# Patient Record
Sex: Male | Born: 1990 | Race: Black or African American | Hispanic: No | Marital: Single
Health system: Southern US, Community
[De-identification: ages and names within clinical notes are randomized; demographics above are authoritative.]

---

## 2019-01-27 ENCOUNTER — Ambulatory Visit: Payer: Self-pay | Attending: Internal Medicine

## 2019-05-01 ENCOUNTER — Emergency Department (HOSPITAL_COMMUNITY): Payer: Self-pay

## 2019-05-01 ENCOUNTER — Other Ambulatory Visit: Payer: Self-pay

## 2019-05-01 ENCOUNTER — Emergency Department (HOSPITAL_COMMUNITY)
Admission: EM | Admit: 2019-05-01 | Discharge: 2019-05-01 | Disposition: A | Discharge status: Home / Self Care | Payer: Self-pay | Attending: Emergency Medicine | Admitting: Emergency Medicine

## 2019-05-01 DIAGNOSIS — F1010 Alcohol abuse, uncomplicated: Secondary | ICD-10-CM | POA: Insufficient documentation

## 2019-05-01 DIAGNOSIS — T148XXA Other injury of unspecified body region, initial encounter: Secondary | ICD-10-CM

## 2019-05-01 DIAGNOSIS — R911 Solitary pulmonary nodule: Secondary | ICD-10-CM | POA: Insufficient documentation

## 2019-05-01 DIAGNOSIS — Y906 Blood alcohol level of 120-199 mg/100 ml: Secondary | ICD-10-CM | POA: Insufficient documentation

## 2019-05-01 DIAGNOSIS — S0181XA Laceration without foreign body of other part of head, initial encounter: Secondary | ICD-10-CM | POA: Insufficient documentation

## 2019-05-01 DIAGNOSIS — Y929 Unspecified place or not applicable: Secondary | ICD-10-CM | POA: Insufficient documentation

## 2019-05-01 DIAGNOSIS — Y998 Other external cause status: Secondary | ICD-10-CM | POA: Insufficient documentation

## 2019-05-01 DIAGNOSIS — S21219A Laceration without foreign body of unspecified back wall of thorax without penetration into thoracic cavity, initial encounter: Secondary | ICD-10-CM | POA: Insufficient documentation

## 2019-05-01 DIAGNOSIS — Y9389 Activity, other specified: Secondary | ICD-10-CM | POA: Insufficient documentation

## 2019-05-01 LAB — CBC
HCT: 42.5 % (ref 39.0–52.0)
Hemoglobin: 13.6 g/dL (ref 13.0–17.0)
MCH: 28.4 pg (ref 26.0–34.0)
MCHC: 32 g/dL (ref 30.0–36.0)
MCV: 88.7 fL (ref 80.0–100.0)
Platelets: 278 10*3/uL (ref 150–400)
RBC: 4.79 MIL/uL (ref 4.22–5.81)
RDW: 12.1 % (ref 11.5–15.5)
WBC: 6.4 10*3/uL (ref 4.0–10.5)
nRBC: 0 % (ref 0.0–0.2)

## 2019-05-01 LAB — PROTIME-INR
INR: 1.1 (ref 0.8–1.2)
Prothrombin Time: 13.6 seconds (ref 11.4–15.2)

## 2019-05-01 LAB — I-STAT CHEM 8, ED
BUN: 8 mg/dL (ref 6–20)
Calcium, Ion: 1.02 mmol/L — ABNORMAL LOW (ref 1.15–1.40)
Chloride: 106 mmol/L (ref 98–111)
Creatinine, Ser: 1.4 mg/dL — ABNORMAL HIGH (ref 0.61–1.24)
Glucose, Bld: 105 mg/dL — ABNORMAL HIGH (ref 70–99)
HCT: 42 % (ref 39.0–52.0)
Hemoglobin: 14.3 g/dL (ref 13.0–17.0)
Potassium: 3.4 mmol/L — ABNORMAL LOW (ref 3.5–5.1)
Sodium: 143 mmol/L (ref 135–145)
TCO2: 21 mmol/L — ABNORMAL LOW (ref 22–32)

## 2019-05-01 LAB — COMPREHENSIVE METABOLIC PANEL
ALT: 26 U/L (ref 0–44)
AST: 35 U/L (ref 15–41)
Albumin: 4.2 g/dL (ref 3.5–5.0)
Alkaline Phosphatase: 69 U/L (ref 38–126)
Anion gap: 19 — ABNORMAL HIGH (ref 5–15)
BUN: 8 mg/dL (ref 6–20)
CO2: 16 mmol/L — ABNORMAL LOW (ref 22–32)
Calcium: 8.7 mg/dL — ABNORMAL LOW (ref 8.9–10.3)
Chloride: 101 mmol/L (ref 98–111)
Creatinine, Ser: 1.27 mg/dL — ABNORMAL HIGH (ref 0.61–1.24)
GFR calc Af Amer: >60 mL/min (ref >60)
GFR calc non Af Amer: >60 mL/min (ref >60)
Glucose, Bld: 115 mg/dL — ABNORMAL HIGH (ref 70–99)
Potassium: 3.3 mmol/L — ABNORMAL LOW (ref 3.5–5.1)
Sodium: 136 mmol/L (ref 135–145)
Total Bilirubin: 0.9 mg/dL (ref 0.3–1.2)
Total Protein: 7.6 g/dL (ref 6.5–8.1)

## 2019-05-01 LAB — SAMPLE TO BLOOD BANK

## 2019-05-01 LAB — LACTIC ACID, PLASMA: Lactic Acid, Venous: 8.7 mmol/L (ref 0.5–1.9)

## 2019-05-01 LAB — ETHANOL: Alcohol, Ethyl (B): 187 mg/dL — ABNORMAL HIGH (ref <10)

## 2019-05-01 MED ORDER — BACITRACIN ZINC 500 UNIT/GM EX OINT
TOPICAL_OINTMENT | Freq: Once | CUTANEOUS | Status: AC
  Filled 2019-05-01: qty 0.9

## 2019-05-01 MED ORDER — HYDROCODONE-ACETAMINOPHEN 5-325 MG PO TABS: 1.0000 | ORAL_TABLET | Freq: Four times a day (QID) | ORAL | 0 refills | Status: DC | PRN

## 2019-05-01 MED ORDER — TETANUS-DIPHTH-ACELL PERTUSSIS 5-2.5-18.5 LF-MCG/0.5 IM SUSP
0.5000 mL | Freq: Once | INTRAMUSCULAR | Status: AC
  Administered 2019-05-01: 0.5 mL via INTRAMUSCULAR

## 2019-05-01 MED ORDER — DOXYCYCLINE HYCLATE 100 MG PO TABS
100.0000 mg | ORAL_TABLET | Freq: Once | ORAL | Status: AC
  Administered 2019-05-01: 100 mg via ORAL
  Filled 2019-05-01: qty 1

## 2019-05-01 MED ORDER — HYDROCODONE-ACETAMINOPHEN 5-325 MG PO TABS
1.0000 | ORAL_TABLET | Freq: Once | ORAL | Status: AC
  Administered 2019-05-01: 1 via ORAL
  Filled 2019-05-01: qty 1

## 2019-05-01 MED ORDER — DOXYCYCLINE HYCLATE 100 MG PO CAPS: 100.0000 mg | ORAL_CAPSULE | Freq: Two times a day (BID) | ORAL | 0 refills | Status: AC

## 2019-05-01 MED ORDER — IOHEXOL 300 MG/ML  SOLN
75.0000 mL | Freq: Once | INTRAMUSCULAR | Status: AC | PRN
  Administered 2019-05-01: 75 mL via INTRAVENOUS

## 2019-05-01 MED ORDER — LIDOCAINE-EPINEPHRINE (PF) 2 %-1:200000 IJ SOLN
20.0000 mL | Freq: Once | INTRAMUSCULAR | Status: AC
  Administered 2019-05-01: 20 mL via INTRADERMAL
  Filled 2019-05-01: qty 20
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
## 2019-05-01 NOTE — ED Notes (Signed)
Patient frequently calls out for pain medication and states that his pain is unbearable. Pt states that he just needs his pain medication and his orders "to go". Pt advised that his nurse and provider are in another room with another patient. Patient again states, "I just my pain medicine and to to", and then states "I'm not trying to rush you". Pulled pt's nurse from another room to assist pt per his request. Will continue to monitor.
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
## 2020-12-12 ENCOUNTER — Encounter (HOSPITAL_COMMUNITY): Payer: Self-pay

## 2020-12-12 ENCOUNTER — Ambulatory Visit (HOSPITAL_COMMUNITY)
Admission: EM | Admit: 2020-12-12 | Discharge: 2020-12-12 | Disposition: A | Discharge status: Home / Self Care | Payer: Self-pay | Attending: Physician Assistant | Admitting: Physician Assistant

## 2020-12-12 ENCOUNTER — Other Ambulatory Visit: Payer: Self-pay

## 2020-12-12 ENCOUNTER — Ambulatory Visit (INDEPENDENT_AMBULATORY_CARE_PROVIDER_SITE_OTHER): Payer: Self-pay

## 2020-12-12 DIAGNOSIS — S61214A Laceration without foreign body of right ring finger without damage to nail, initial encounter: Secondary | ICD-10-CM

## 2020-12-12 DIAGNOSIS — Z113 Encounter for screening for infections with a predominantly sexual mode of transmission: Secondary | ICD-10-CM | POA: Insufficient documentation

## 2020-12-12 DIAGNOSIS — S6991XA Unspecified injury of right wrist, hand and finger(s), initial encounter: Secondary | ICD-10-CM | POA: Insufficient documentation

## 2020-12-12 DIAGNOSIS — S61314A Laceration without foreign body of right ring finger with damage to nail, initial encounter: Secondary | ICD-10-CM | POA: Insufficient documentation

## 2020-12-12 MED ORDER — CEPHALEXIN 500 MG PO CAPS: 500.0000 mg | ORAL_CAPSULE | Freq: Four times a day (QID) | ORAL | 0 refills | Status: AC

## 2020-12-12 NOTE — Discharge Instructions (Signed)
Your x-ray was normal with no evidence of a fracture or dislocation.  Please keep area clean as we discussed.  Start antibiotics.  Follow-up with hand surgeon.  Please call them to schedule an appointment on Friday.  In the meantime, if you have any worsening symptoms including increased pain, redness, swelling, difficulty moving your finger you must go to the emergency room.
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
## 2020-12-12 NOTE — ED Notes (Signed)
PT given information on signs of decreased perfusion and instructed to unwrap dressing and re dress loosely if decreased perfusion suspected. If symptoms do not resolve within minutes, seek emergency care.

## 2020-12-12 NOTE — ED Provider Notes (Addendum)
[facility]    CSN: [id number] Arrival date & time: 12/12/20  4818      History   Chief Complaint Chief Complaint  Patient presents with   Laceration   SEXUALLY TRANSMITTED DISEASE    HPI Francisco Salazar is a 30 y.o. male.   Patient presents today that approximately 2 days ago he was bowling when he slipped and fell causing a ball to grip his right hand leading to a laceration.  He reports ongoing pain and bleeding since that time.  Pain is rated 7 on a 0-10 pain scale, localized to laceration, described as aching, no aggravating relieving factors identified.  He has applied antibiotic ointment without improvement of symptoms.  He reports ongoing bleeding prompting evaluation.  He is right-handed.  Denies any numbness or paresthesias but is having difficulty flexing distal right ring finger.  Patient initially wanted to be tested for STI but when he found out that we have penile swabs rather than urine declined to have this done and will go to health department instead where they have urine testing.  He did have some symptoms including urinary frequency but states these have since resolved.  Denies any known exposure.   History reviewed. No pertinent past medical history.  There are no problems to display for this patient.   History reviewed. No pertinent surgical history.     Home Medications    Prior to Admission medications   Medication Sig Start Date End Date Taking? Authorizing Provider  cephALEXin (KEFLEX) 500 MG capsule Take 1 capsule (500 mg total) by mouth 4 (four) times daily. 12/12/20  Yes Gladiola Madore, Noberto Retort, PA-C    Family History History reviewed. No pertinent family history.  Social History Social History   Tobacco Use   Smoking status: Never   Smokeless tobacco: Never  Substance Use Topics   Alcohol use: Yes   Drug use: Not Currently     Allergies   Patient has no known allergies.   Review of Systems Review of Systems   Constitutional:  Positive for activity change. Negative for appetite change, fatigue and fever.  Respiratory:  Negative for cough and shortness of breath.   Cardiovascular:  Negative for chest pain.  Gastrointestinal:  Negative for abdominal pain, diarrhea, nausea and vomiting.  Musculoskeletal:  Positive for arthralgias and joint swelling. Negative for myalgias.  Skin:  Positive for wound.  Neurological:  Negative for dizziness, weakness, light-headedness, numbness and headaches.    Physical Exam Triage Vital Signs ED Triage Vitals  Enc Vitals Group     BP 12/12/20 0938 (!) 147/93     Pulse Rate 12/12/20 0938 65     Resp 12/12/20 0938 16     Temp 12/12/20 0938 98.1 F (36.7 C)     Temp Source 12/12/20 0938 Oral     SpO2 12/12/20 0938 100 %     Weight --      Height --      Head Circumference --      Peak Flow --      Pain Score 12/12/20 0939 7     Pain Loc --      Pain Edu? --      Excl. in GC? --    No data found.  Updated Vital Signs BP (!) 147/93 (BP Location: Right Arm)   Pulse 65   Temp 98.1 F (36.7 C) (Oral)   Resp 16   SpO2 100%   Visual Acuity Right Eye Distance:   Left  Eye Distance:   Bilateral Distance:    Right Eye Near:   Left Eye Near:    Bilateral Near:     Physical Exam Vitals reviewed.  Constitutional:      General: He is awake.     Appearance: Normal appearance. He is well-developed. He is not ill-appearing.     Comments: Very pleasant male appears at age in no acute distress sitting comfortably in exam room  HENT:     Head: Normocephalic and atraumatic.  Cardiovascular:     Rate and Rhythm: Normal rate and regular rhythm.     Pulses:          Radial pulses are 2+ on the right side and 2+ on the left side.     Heart sounds: Normal heart sounds, S1 normal and S2 normal. No murmur heard.    Comments: Capillary refill within 2 seconds right fingers Pulmonary:     Effort: Pulmonary effort is normal.     Breath sounds: Normal breath  sounds. No stridor. No wheezing, rhonchi or rales.     Comments: Clear auscultation bilaterally Abdominal:     Palpations: Abdomen is soft.     Tenderness: There is no abdominal tenderness.  Musculoskeletal:     Right hand: Swelling and tenderness present. No bony tenderness. Decreased range of motion. Decreased strength. There is no disruption of two-point discrimination. Normal capillary refill.     Comments: Right ring finger: Decreased range of motion with flexion and DIP.  Finger neurovascular intact.  Skin:    Findings: Laceration present.     Comments: 5 cm laceration noted palmar surface of right ring finger.  No active bleeding or drainage noted.  No streaking or evidence of lymphangitis.  Neurological:     Mental Status: He is alert.  Psychiatric:        Behavior: Behavior is cooperative.       UC Treatments / Results  Labs (all labs ordered are listed, but only abnormal results are displayed) Labs Reviewed  CYTOLOGY, (ORAL, ANAL, URETHRAL) ANCILLARY ONLY    EKG   Radiology DG Finger Ring Right  Result Date: 12/12/2020 CLINICAL DATA:  Finger stuck in bowling ball 2 days ago, laceration at distal PIP jointpain and DROM after injury EXAM: RIGHT RING FINGER 2+V COMPARISON:  None. FINDINGS: There is no evidence of fracture or dislocation. There is no evidence of arthropathy or other focal bone abnormality. Soft tissue injury along the ventral surface of the digit IMPRESSION: No fracture or dislocation Electronically Signed   By: Genevive Bi M.D.   On: 12/12/2020 10:36    Procedures Procedures (including critical care time)  Medications Ordered in UC Medications - No data to display  Initial Impression / Assessment and Plan / UC Course  I have reviewed the triage vital signs and the nursing notes.  Pertinent labs & imaging results that were available during my care of the patient were reviewed by me and considered in my medical decision making (see chart for  details).     Laceration is 48 hours old and does not approximate swell by pressure dressing placed in splint for stability but allow for closure by secondary intention given nature of wound.  Given this is his dominant hand I recommended that he follow-up with hand specialist for further evaluation and management was given contact information for provider on-call.  Recommend he call and schedule an appointment as soon as possible for further evaluation and management.  Will cover with Keflex.  He was instructed to keep area clean.  Can use Tylenol and ibuprofen for pain relief.  Discussed at length alarm symptoms that warrant emergent evaluation.  Strict return precautions given to which he expressed understanding.  Patient declined STI testing as he did not want to have swab performed.  We will follow-up with health department for further evaluation and management.  Addendum: Prior to discharge patient decided that he was willing to give a swab.  STI swab was collected and orders placed.  We will contact him to arrange treatment based on results.  Final Clinical Impressions(s) / UC Diagnoses   Final diagnoses:  Injury of finger of right hand, initial encounter  Laceration of right ring finger without foreign body with damage to nail, initial encounter  Routine screening for STI (sexually transmitted infection)     Discharge Instructions      Your x-ray was normal with no evidence of a fracture or dislocation.  Please keep area clean as we discussed.  Start antibiotics.  Follow-up with hand surgeon.  Please call them to schedule an appointment on Friday.  In the meantime, if you have any worsening symptoms including increased pain, redness, swelling, difficulty moving your finger you must go to the emergency room.     ED Prescriptions     Medication Sig Dispense Auth. Provider   cephALEXin (KEFLEX) 500 MG capsule Take 1 capsule (500 mg total) by mouth 4 (four) times daily. 20 capsule  Aalayah Riles, Noberto Retort, PA-C      PDMP not reviewed this encounter.   Jeani Hawking, PA-C 12/12/20 1100    Lalia Loudon, Noberto Retort, PA-C 12/12/20 1116

## 2020-12-12 NOTE — ED Notes (Signed)
Pt declined self swab for STD testing.

## 2020-12-14 LAB — CYTOLOGY, (ORAL, ANAL, URETHRAL) ANCILLARY ONLY
Chlamydia: NEGATIVE
Comment: NEGATIVE
Comment: NEGATIVE
Comment: NORMAL
Neisseria Gonorrhea: NEGATIVE
Trichomonas: NEGATIVE

## 2023-06-06 IMAGING — DX DG FINGER RING 2+V*R*
3 series · 3 of 3 positions shown · non-contrast
Comparison: None.

CLINICAL DATA: Finger stuck in bowling ball 2 days ago, laceration
at distal PIP jointpain and DROM after injury

EXAM:
RIGHT RING FINGER 2+V

[finger ap]
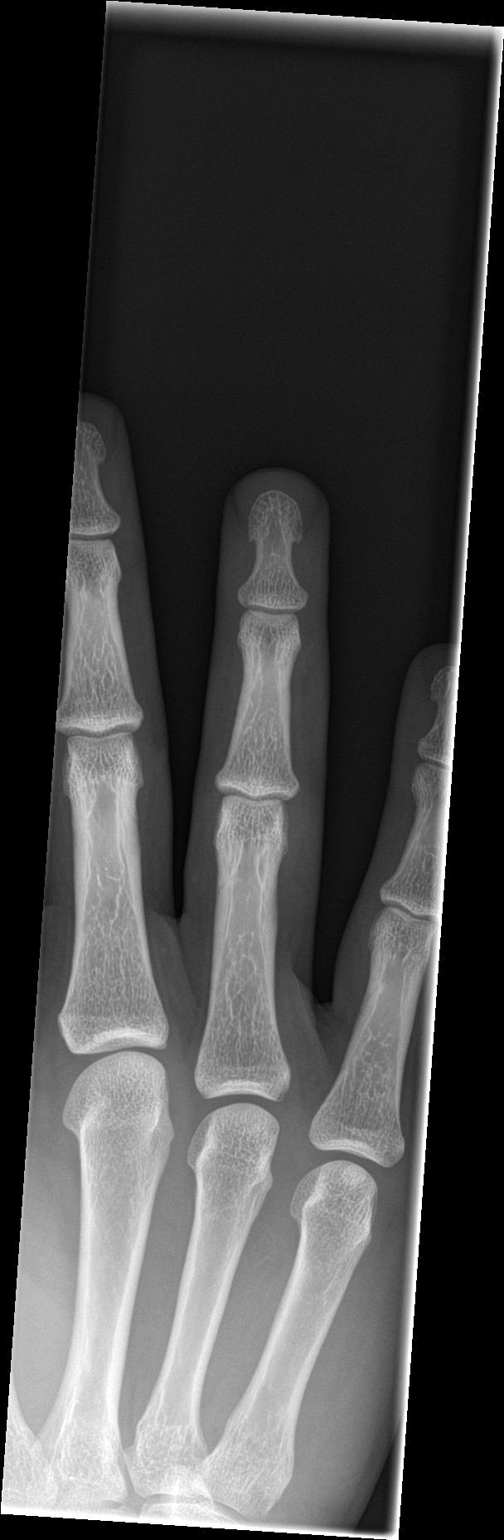

[finger obl]
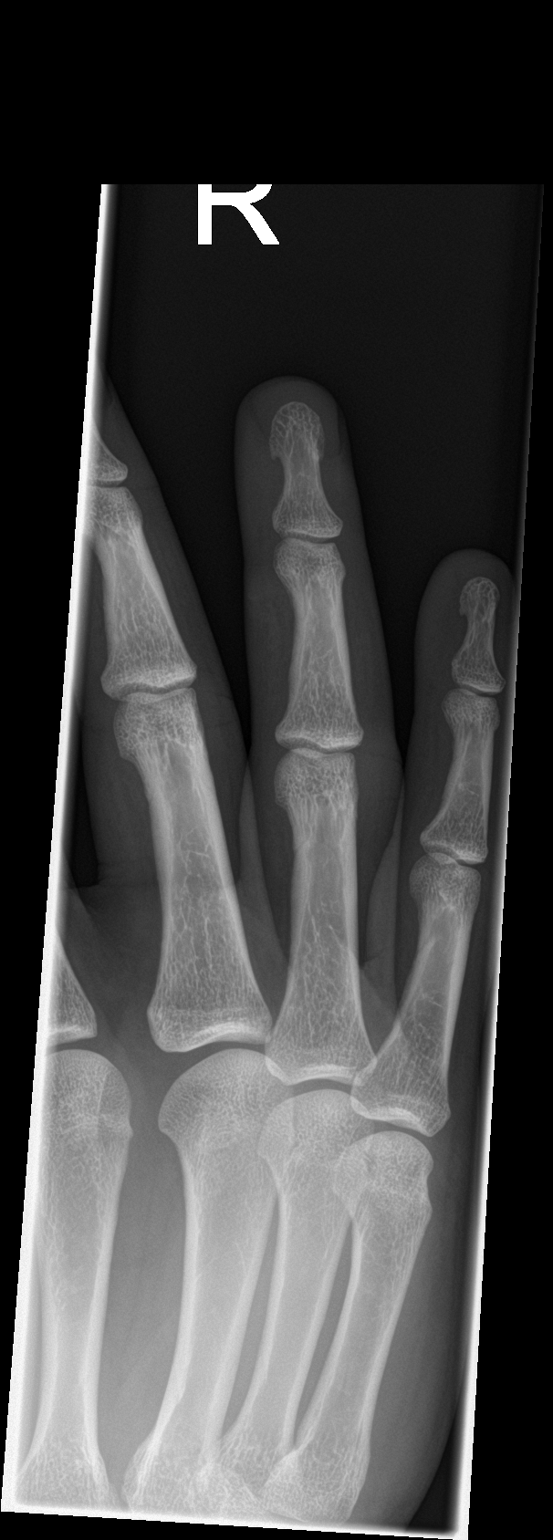

[finger lat]
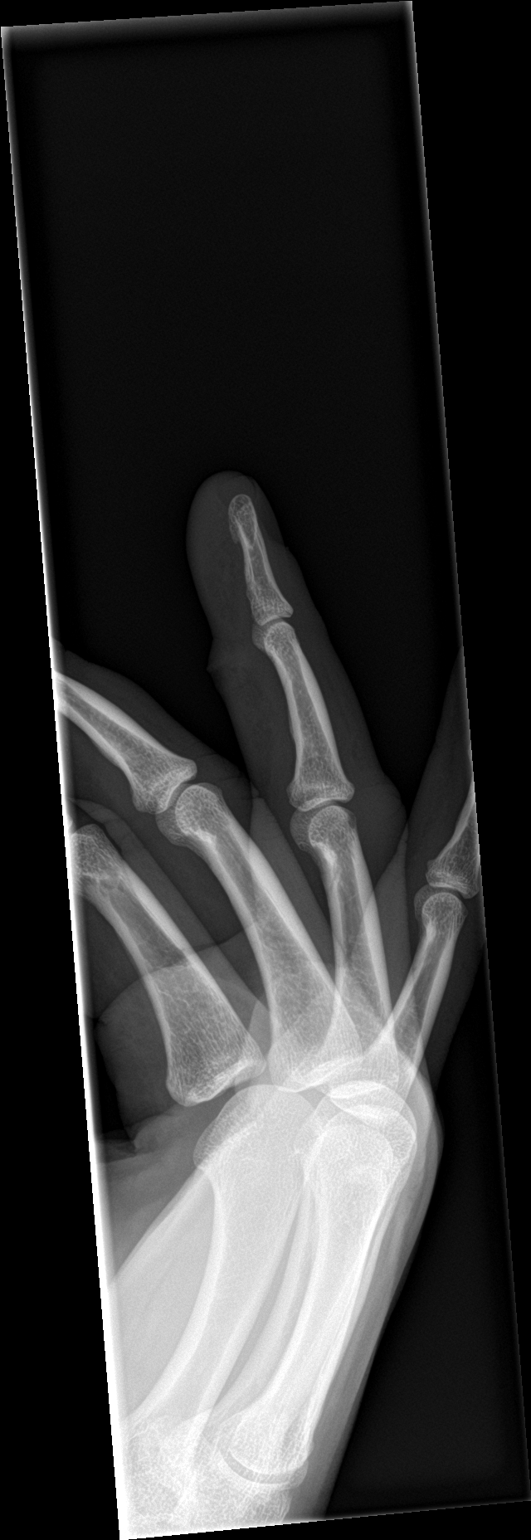

[3 of 3 positions shown; findings below may reference images not displayed]

FINDINGS: There is no evidence of fracture or dislocation. There is no
evidence of arthropathy or other focal bone abnormality. Soft tissue
injury along the ventral surface of the digit
IMPRESSION: No fracture or dislocation
